# Patient Record
Sex: Female | Born: 1935
Health system: Southern US, Community
[De-identification: ages and names within clinical notes are randomized; demographics above are authoritative.]

## PROBLEM LIST (undated history)

## (undated) DIAGNOSIS — K219 Gastro-esophageal reflux disease without esophagitis: Secondary | ICD-10-CM

## (undated) DIAGNOSIS — D510 Vitamin B12 deficiency anemia due to intrinsic factor deficiency: Secondary | ICD-10-CM

## (undated) DIAGNOSIS — M810 Age-related osteoporosis without current pathological fracture: Secondary | ICD-10-CM

## (undated) DIAGNOSIS — Z8673 Personal history of transient ischemic attack (TIA), and cerebral infarction without residual deficits: Secondary | ICD-10-CM

## (undated) DIAGNOSIS — E7800 Pure hypercholesterolemia, unspecified: Secondary | ICD-10-CM

## (undated) DIAGNOSIS — I10 Essential (primary) hypertension: Secondary | ICD-10-CM

## (undated) DIAGNOSIS — M1990 Unspecified osteoarthritis, unspecified site: Secondary | ICD-10-CM

## (undated) HISTORY — PX: OTHER SURGICAL HISTORY: SHX169

## (undated) HISTORY — DX: Unspecified osteoarthritis, unspecified site: M19.90

## (undated) HISTORY — DX: Personal history of transient ischemic attack (TIA), and cerebral infarction without residual deficits: Z86.73

## (undated) HISTORY — DX: Age-related osteoporosis without current pathological fracture: M81.0

## (undated) HISTORY — DX: Vitamin B12 deficiency anemia due to intrinsic factor deficiency: D51.0

## (undated) HISTORY — DX: Pure hypercholesterolemia, unspecified: E78.00

## (undated) HISTORY — PX: CATARACT EXTRACTION: SUR2

## (undated) HISTORY — DX: Gastro-esophageal reflux disease without esophagitis: K21.9

## (undated) HISTORY — DX: Essential (primary) hypertension: I10

---

## 1997-09-26 DIAGNOSIS — Z8673 Personal history of transient ischemic attack (TIA), and cerebral infarction without residual deficits: Secondary | ICD-10-CM

## 1997-09-26 HISTORY — DX: Personal history of transient ischemic attack (TIA), and cerebral infarction without residual deficits: Z86.73

## 2000-02-06 ENCOUNTER — Emergency Department (HOSPITAL_COMMUNITY): Admission: EM | Admit: 2000-02-06 | Discharge: 2000-02-07 | Payer: Self-pay | Admitting: Emergency Medicine

## 2001-07-11 ENCOUNTER — Other Ambulatory Visit: Admission: RE | Admit: 2001-07-11 | Discharge: 2001-07-11 | Payer: Self-pay | Admitting: Family Medicine

## 2001-11-25 ENCOUNTER — Emergency Department (HOSPITAL_COMMUNITY): Admission: EM | Admit: 2001-11-25 | Discharge: 2001-11-25 | Payer: Self-pay | Admitting: Emergency Medicine

## 2001-11-25 ENCOUNTER — Encounter: Payer: Self-pay | Admitting: Emergency Medicine

## 2002-03-25 ENCOUNTER — Ambulatory Visit (HOSPITAL_BASED_OUTPATIENT_CLINIC_OR_DEPARTMENT_OTHER): Admission: RE | Admit: 2002-03-25 | Discharge: 2002-03-25 | Payer: Self-pay | Admitting: Urology

## 2002-03-25 ENCOUNTER — Encounter: Payer: Self-pay | Admitting: Urology

## 2002-05-21 ENCOUNTER — Ambulatory Visit (HOSPITAL_COMMUNITY): Admission: RE | Admit: 2002-05-21 | Discharge: 2002-05-21 | Payer: Self-pay | Admitting: Gastroenterology

## 2003-07-01 ENCOUNTER — Other Ambulatory Visit: Admission: RE | Admit: 2003-07-01 | Discharge: 2003-07-01 | Payer: Self-pay | Admitting: Family Medicine

## 2006-09-04 ENCOUNTER — Other Ambulatory Visit: Admission: RE | Admit: 2006-09-04 | Discharge: 2006-09-04 | Payer: Self-pay | Admitting: Family Medicine

## 2009-03-04 IMAGING — US US ABDOMEN COMPLETE
1 series · 13 of 25 positions shown · non-contrast
Comparison: NONE

CLINICAL DATA: Abdominal pain. 

ABDOMINAL ULTRASOUND

[Series 1: us abd · 0.28mm/px · 13 of 74 slices shown]
[im 1/74]
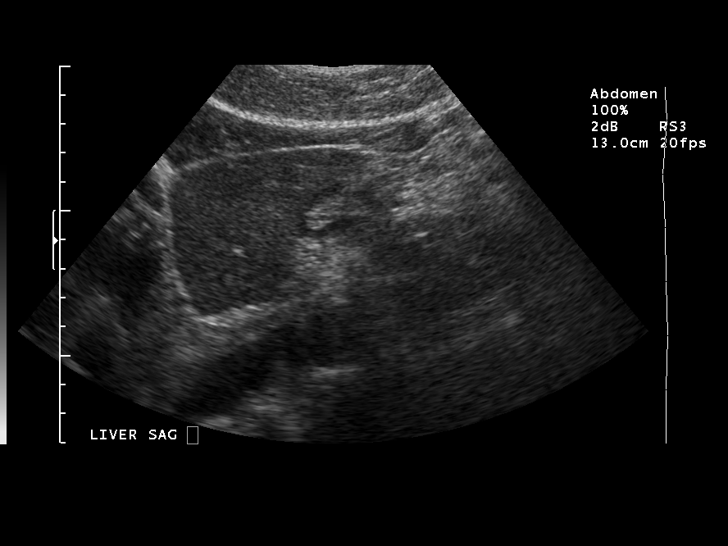
[im 7/74]
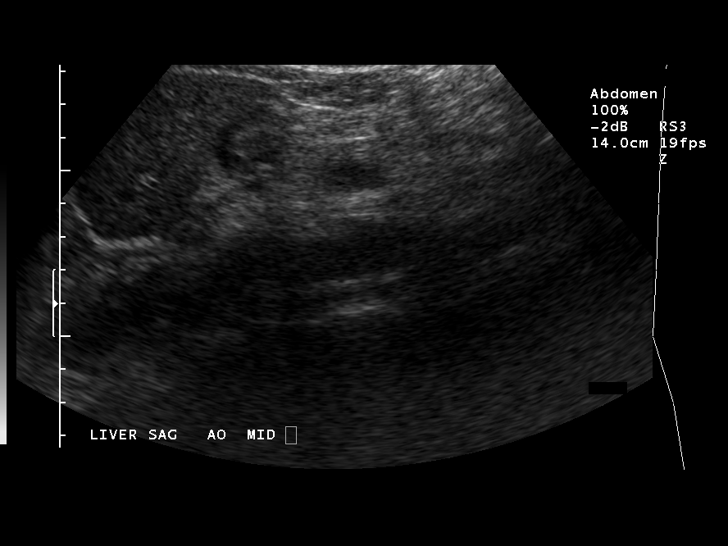
[im 13/74]
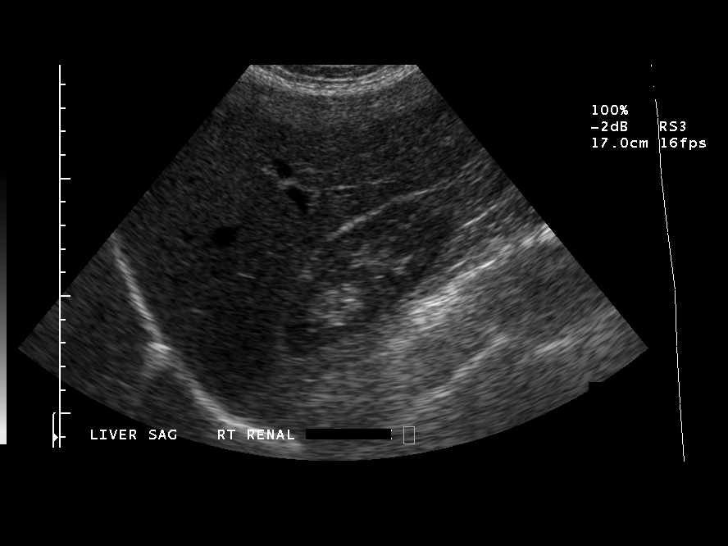
[im 19/74]
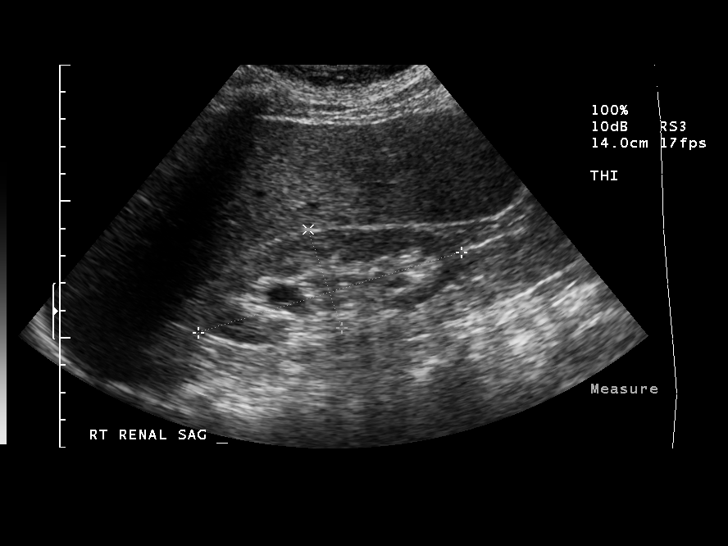
[im 25/74]
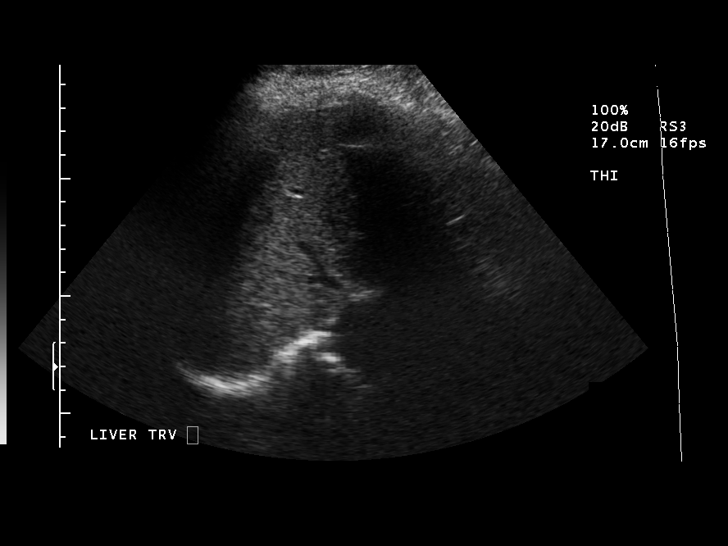
[im 31/74]
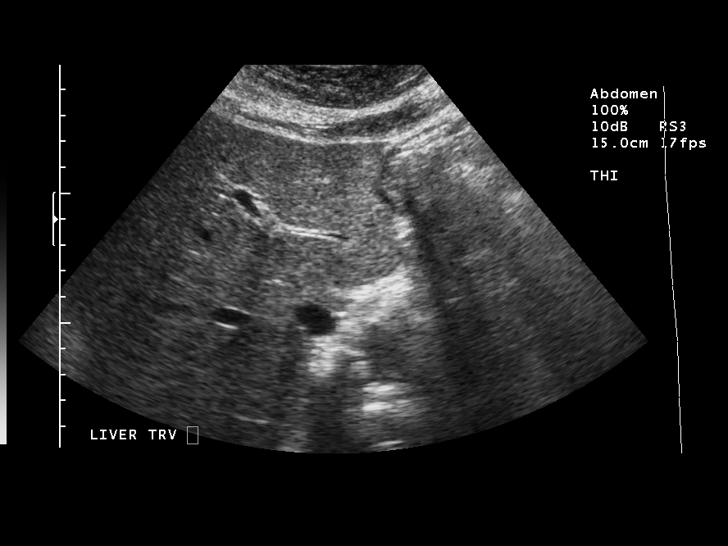
[im 37/74]
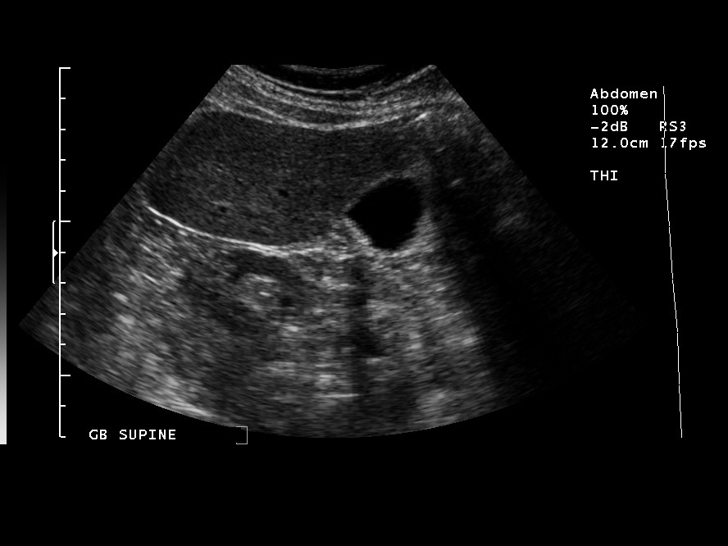
[im 43/74]
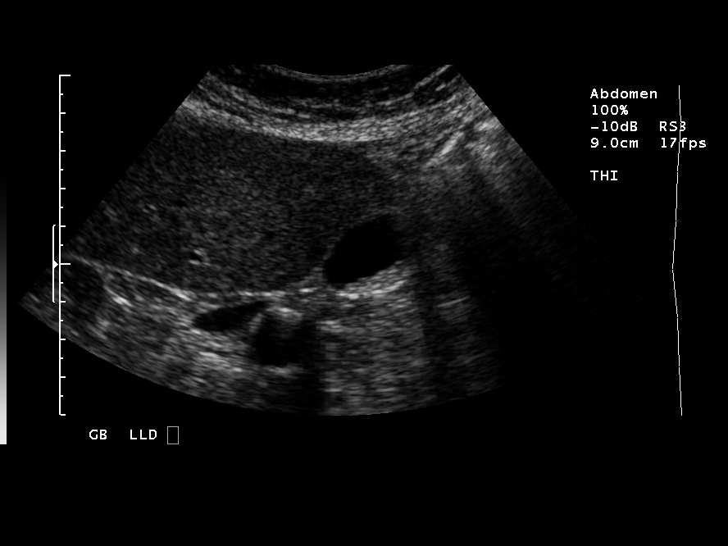
[im 49/74]
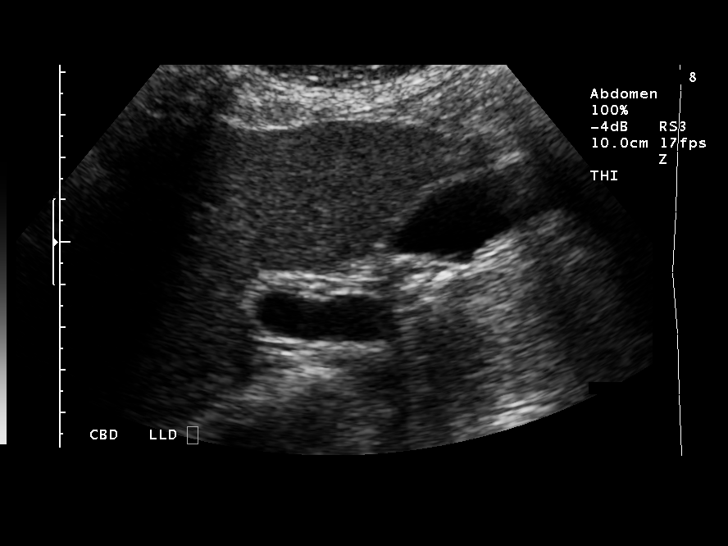
[im 55/74]
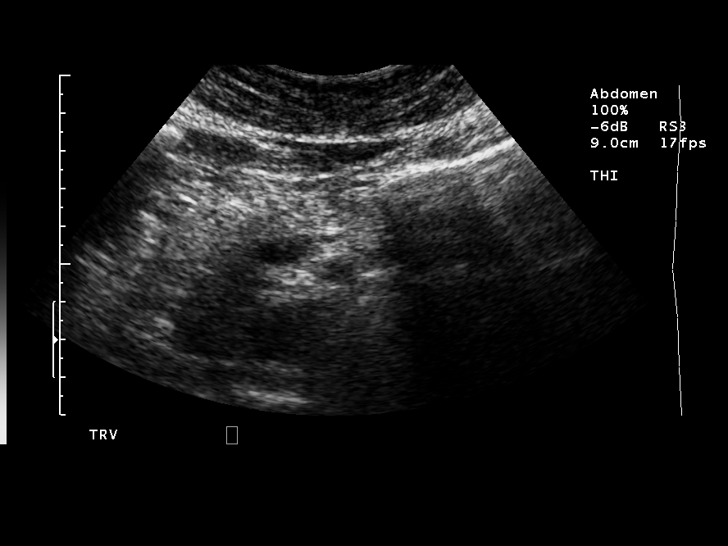
[im 61/74]
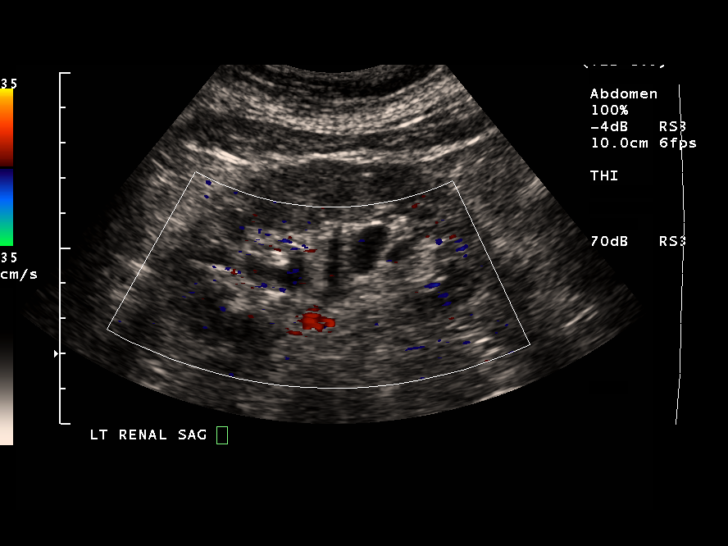
[im 67/74]
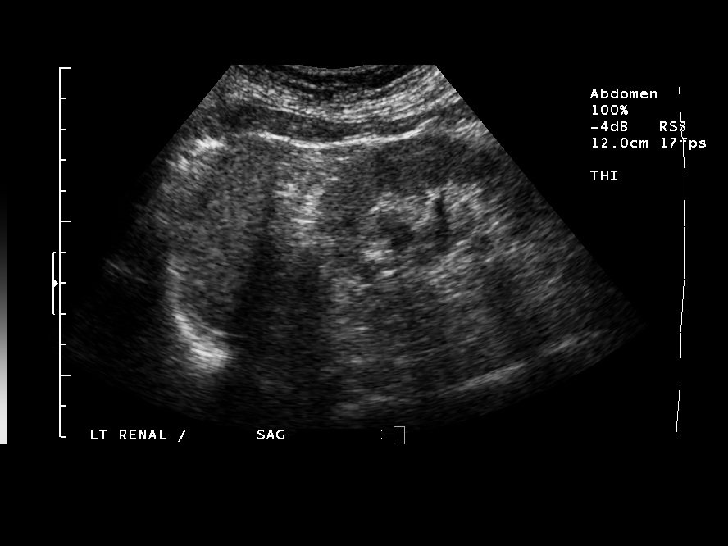
[im 74/74]
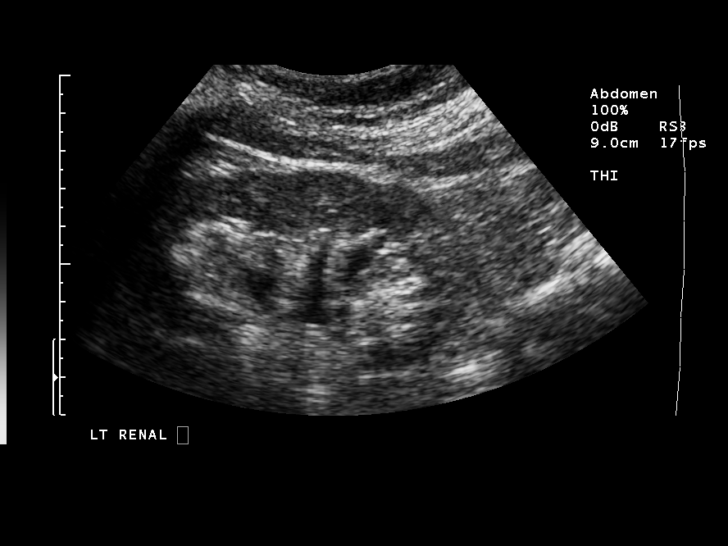

[13 of 25 positions shown; findings below may reference images not displayed]

FINDINGS: The liver is normal in size and echo appearance. There 
is no intrahepatic ductal dilatation or mass identified. The 
gallbladder contains a small amount of dependent echogenic 
material. There is no definite shadowing demonstrated. Appearances 
favor sludge, although the presence of millimeter-sized, 
non-shadowing stones cannot be excluded. The common duct is 
non-dilated measuring 0.29 cm. The pancreas was not well 
demonstrated. The abdominal aorta was normal in caliber and echo 
appearance with AP measurements of 2.4 cm proximally, mid 1.5 cm, 
and distally 1.3 cm. The spleen was normal in size and echo 
appearance measuring 7.7 cm. The right kidney measured 10 cm in 
length. Echo appearance was normal. A prominent calyx versus a 
peripelvic cyst was noted in the upper pole measuring 1.2 cm. The 
left kidney measured 10.5 cm in longitudinal dimension. There was 
dilatation of the left renal pelvis and calices consistent with 
mild hydronephrosis.
IMPRESSION: Sludge within the gallbladder. The possibility of 
millimeter-sized, non-shadowing stones within the sludge cannot be 
excluded. Non-visualization of the pancreas. Prominent right renal 
calyx versus peripelvic cyst. Dilatation of the left pelvis and 
calices suggesting mild hydronephrosis, although this could be 
persistent dilatation from prior obstructive episodes. Yukyuk Khandelwal 
Maruda, M.D. electronically reviewed on 06/27/2007 Dict Date: 
06/27/2007  Tran Date: 06/27/2007 CAV  [REDACTED]

## 2011-05-24 ENCOUNTER — Encounter: Payer: Self-pay | Admitting: *Deleted

## 2011-05-24 ENCOUNTER — Ambulatory Visit (INDEPENDENT_AMBULATORY_CARE_PROVIDER_SITE_OTHER): Payer: Medicare Other | Admitting: Endocrinology

## 2011-05-24 ENCOUNTER — Encounter: Payer: Self-pay | Admitting: Endocrinology

## 2011-05-24 ENCOUNTER — Other Ambulatory Visit (INDEPENDENT_AMBULATORY_CARE_PROVIDER_SITE_OTHER): Payer: Medicare Other

## 2011-05-24 DIAGNOSIS — H409 Unspecified glaucoma: Secondary | ICD-10-CM | POA: Insufficient documentation

## 2011-05-24 DIAGNOSIS — I10 Essential (primary) hypertension: Secondary | ICD-10-CM | POA: Insufficient documentation

## 2011-05-24 DIAGNOSIS — E7800 Pure hypercholesterolemia, unspecified: Secondary | ICD-10-CM

## 2011-05-24 DIAGNOSIS — M810 Age-related osteoporosis without current pathological fracture: Secondary | ICD-10-CM

## 2011-05-24 DIAGNOSIS — K219 Gastro-esophageal reflux disease without esophagitis: Secondary | ICD-10-CM | POA: Insufficient documentation

## 2011-05-24 NOTE — Progress Notes (Signed)
Subjective:    Patient ID: Samantha Thomas, female    DOB: December 20, 1923, 77 y.o.   MRN: [id number]  HPI Pt was noted on screening to have osteoporosis.  She was started on fosamax, but was only able to take 1 dose.  Since she took this, she has had a few weeks of moderate dryness of the mouth, but no assoc exac of her chronic heartburn.  She was on evista x 1 year, but she says this was stopped due to the request of her dentist.   She does not wish to retry the fosamax.  She had menopause at age 73.  She denies h/o renal dz, thyroid problems, prolonged bedrest, steriods, alcoholism, smoking, liver dz, vid-d deficiency, primary hyperparathyroidism, heparin, or seizures.   Past Medical History  Diagnosis Date  . Essential hypertension, benign   . Pure hypercholesterolemia   . Esophageal reflux   . History of stroke   . Glaucoma     Past Surgical History  Procedure Date  . Cataract extraction     History   Social History  . Marital Status: Married    Spouse Name: N/A    Number of Children: 5  . Years of Education: 15   Occupational History  . Retired    Social History Main Topics  . Smoking status: Never Smoker   . Smokeless tobacco: Not on file  . Alcohol Use: No  . Drug Use: No  . Sexually Active: Not on file   Other Topics Concern  . Not on file   Social History Narrative  . No narrative on file    No current outpatient prescriptions on file prior to visit.    Allergies  Allergen Reactions  . Alendronate Sodium     Family History  Problem Relation Age of Onset  . Cancer Mother   . Heart attack Father     died in 38's  . Hypertension Other     Parents  . Diabetes Other     Grandparent  no osteoporosis.  BP 110/70  Pulse 71  Temp(Src) 98.1 F (36.7 C) (Oral)  Ht 5' (1.524 m)  Wt 116 lb 12.8 oz (52.98 kg)  BMI 22.81 kg/m2  SpO2 96%  Review of Systems denies cramps, muscle weakness, n/v, syncope, palpitations, rash, diarrhea, sob, numbness, fever,  urinary frequency, blury vision, and easy bruising.  She has fatigue and rhinorrhea.  She has lost a few lbs, due to her efforts.     Objective:   Physical Exam VS: see vs page GEN: no distress HEAD: head: no deformity eyes: no periorbital swelling, no proptosis external nose and ears are normal mouth: no lesion seen NECK: supple, thyroid is not enlarged CHEST WALL: no deformity.   CV: reg rate and rhythm, no murmur ABD: abdomen is soft, nontender.  no hepatosplenomegaly.  not distended.  no hernia MUSCULOSKELETAL: muscle bulk and strength are grossly normal.  no obvious joint swelling.  gait is normal and steady.  No kyphosis of the spine. EXTEMITIES:  no edema.  There are oa changes on the hands PULSES: dorsalis pedis intact bilat.   NEURO:  cn 2-12 grossly intact.   readily moves all 4's.  sensation is intact to touch on the feet SKIN:  Normal texture and temperature.  No rash or suspicious lesion is visible.   NODES:  None palpable at the neck PSYCH: alert, oriented x3.  Does not appear anxious nor depressed.  outside test results are reviewed: i reviewed dexa Ca++=9.5  Creat=normal    Assessment & Plan:  Osteoporosis.  i feel pt should retry the fosamax, or to take reclast.  Pt declines.  Dry mouth.  Pt feels this was caused by fosamax, but i think this is probably not the cause.  Genella Rife.  This did not appear to worsen with fosamax.

## 2011-05-24 NOTE — Patient Instructions (Addendum)
blood tests are being requested for you today.  please call [phone] to hear your test results.  You will be prompted to enter the 9-digit "MRN" number that appears at the top left of this page, followed by #.  Then you will hear the message. We'll request authorization for "prolia." twice yearly injection.  We'll let you know when to come back for it. it is critically important to prevent falling down (keep floor areas well-lit, dry, and free of loose objects)

## 2011-05-25 LAB — VITAMIN D 25 HYDROXY (VIT D DEFICIENCY, FRACTURES): Vit D, 25-Hydroxy: 55 ng/mL (ref 30–89)

## 2011-05-25 LAB — PTH, INTACT AND CALCIUM
Calcium, Total (PTH): 9.6 mg/dL (ref 8.4–10.5)
PTH: 43.1 pg/mL (ref 14.0–72.0)

## 2011-06-10 ENCOUNTER — Telehealth: Payer: Self-pay | Admitting: *Deleted

## 2011-06-10 NOTE — Telephone Encounter (Signed)
Message copied by Merrilyn Puma on Fri Jun 10, 2011  8:06 AM ------      Message from: Carin Primrose      Created: Thu Jun 02, 2011  1:05 PM      Regarding: RE: Prolia       Yes      ----- Message -----         From: Lanier Prude, CMA         Sent: 06/02/2011  11:41 AM           To: Brenton Grills, CMA      Subject: RE: Prolia                                               No, do I need to verify benefits?      ----- Message -----         From: Brenton Grills, CMA         Sent: 06/02/2011  11:10 AM           To: Lanier Prude, CMA      Subject: Prolia                                                   Did Dr. Everardo All send you a message regarding this pt for Prolia? I just wanted to make sure. Thanks.

## 2011-06-10 NOTE — Telephone Encounter (Signed)
Faxed ins verification request to Prolia. Will wait for benefit summary.

## 2011-06-16 NOTE — Telephone Encounter (Signed)
rec pt's summary of benefits for Prolia. Her OOP is 20% approx $165. Pt informed and she states she will discuss this with her husband and call us back if/when she wants to schedule Prolia inj.

## 2014-11-05 DIAGNOSIS — B351 Tinea unguium: Secondary | ICD-10-CM | POA: Diagnosis not present

## 2015-01-16 DIAGNOSIS — J209 Acute bronchitis, unspecified: Secondary | ICD-10-CM | POA: Diagnosis not present

## 2015-01-23 DIAGNOSIS — J209 Acute bronchitis, unspecified: Secondary | ICD-10-CM | POA: Diagnosis not present

## 2015-02-09 DIAGNOSIS — B351 Tinea unguium: Secondary | ICD-10-CM | POA: Diagnosis not present

## 2015-02-10 DIAGNOSIS — J209 Acute bronchitis, unspecified: Secondary | ICD-10-CM | POA: Diagnosis not present

## 2015-02-12 DIAGNOSIS — J209 Acute bronchitis, unspecified: Secondary | ICD-10-CM | POA: Diagnosis not present

## 2015-02-23 DIAGNOSIS — J209 Acute bronchitis, unspecified: Secondary | ICD-10-CM | POA: Diagnosis not present

## 2015-02-23 DIAGNOSIS — R6889 Other general symptoms and signs: Secondary | ICD-10-CM | POA: Diagnosis not present

## 2015-02-23 DIAGNOSIS — T148 Other injury of unspecified body region: Secondary | ICD-10-CM | POA: Diagnosis not present

## 2015-02-23 DIAGNOSIS — H6120 Impacted cerumen, unspecified ear: Secondary | ICD-10-CM | POA: Diagnosis not present

## 2015-03-06 DIAGNOSIS — H3530 Unspecified macular degeneration: Secondary | ICD-10-CM | POA: Diagnosis not present

## 2015-03-06 DIAGNOSIS — H4011X2 Primary open-angle glaucoma, moderate stage: Secondary | ICD-10-CM | POA: Diagnosis not present

## 2015-03-26 DIAGNOSIS — I10 Essential (primary) hypertension: Secondary | ICD-10-CM | POA: Diagnosis not present

## 2015-03-26 DIAGNOSIS — E780 Pure hypercholesterolemia: Secondary | ICD-10-CM | POA: Diagnosis not present

## 2015-03-26 DIAGNOSIS — E569 Vitamin deficiency, unspecified: Secondary | ICD-10-CM | POA: Diagnosis not present

## 2015-03-26 DIAGNOSIS — I639 Cerebral infarction, unspecified: Secondary | ICD-10-CM | POA: Diagnosis not present

## 2015-04-10 DIAGNOSIS — H4011X2 Primary open-angle glaucoma, moderate stage: Secondary | ICD-10-CM | POA: Diagnosis not present

## 2015-04-25 DIAGNOSIS — E569 Vitamin deficiency, unspecified: Secondary | ICD-10-CM | POA: Diagnosis not present

## 2015-04-25 DIAGNOSIS — K219 Gastro-esophageal reflux disease without esophagitis: Secondary | ICD-10-CM | POA: Diagnosis not present

## 2015-04-25 DIAGNOSIS — E780 Pure hypercholesterolemia: Secondary | ICD-10-CM | POA: Diagnosis not present

## 2015-04-26 DIAGNOSIS — K219 Gastro-esophageal reflux disease without esophagitis: Secondary | ICD-10-CM | POA: Diagnosis not present

## 2015-04-26 DIAGNOSIS — R531 Weakness: Secondary | ICD-10-CM | POA: Diagnosis not present

## 2015-04-26 DIAGNOSIS — R200 Anesthesia of skin: Secondary | ICD-10-CM | POA: Diagnosis not present

## 2015-04-26 DIAGNOSIS — I10 Essential (primary) hypertension: Secondary | ICD-10-CM | POA: Diagnosis not present

## 2015-04-26 DIAGNOSIS — E780 Pure hypercholesterolemia: Secondary | ICD-10-CM | POA: Diagnosis not present

## 2015-04-26 DIAGNOSIS — R202 Paresthesia of skin: Secondary | ICD-10-CM | POA: Diagnosis not present

## 2015-04-26 DIAGNOSIS — D649 Anemia, unspecified: Secondary | ICD-10-CM | POA: Diagnosis not present

## 2015-04-26 DIAGNOSIS — J986 Disorders of diaphragm: Secondary | ICD-10-CM | POA: Diagnosis not present

## 2015-04-26 DIAGNOSIS — R42 Dizziness and giddiness: Secondary | ICD-10-CM | POA: Diagnosis not present

## 2015-04-26 DIAGNOSIS — N3000 Acute cystitis without hematuria: Secondary | ICD-10-CM | POA: Diagnosis not present

## 2015-04-26 DIAGNOSIS — Z79899 Other long term (current) drug therapy: Secondary | ICD-10-CM | POA: Diagnosis not present

## 2015-04-26 DIAGNOSIS — Z8673 Personal history of transient ischemic attack (TIA), and cerebral infarction without residual deficits: Secondary | ICD-10-CM | POA: Diagnosis not present

## 2015-05-05 DIAGNOSIS — N390 Urinary tract infection, site not specified: Secondary | ICD-10-CM | POA: Diagnosis not present

## 2015-05-05 DIAGNOSIS — Z9181 History of falling: Secondary | ICD-10-CM | POA: Diagnosis not present

## 2015-05-05 DIAGNOSIS — Z1389 Encounter for screening for other disorder: Secondary | ICD-10-CM | POA: Diagnosis not present

## 2015-05-05 DIAGNOSIS — I639 Cerebral infarction, unspecified: Secondary | ICD-10-CM | POA: Diagnosis not present

## 2015-05-13 DIAGNOSIS — N3941 Urge incontinence: Secondary | ICD-10-CM | POA: Diagnosis not present

## 2015-05-13 DIAGNOSIS — J302 Other seasonal allergic rhinitis: Secondary | ICD-10-CM | POA: Diagnosis not present

## 2015-05-20 DIAGNOSIS — B351 Tinea unguium: Secondary | ICD-10-CM | POA: Diagnosis not present

## 2015-05-26 DIAGNOSIS — E782 Mixed hyperlipidemia: Secondary | ICD-10-CM | POA: Diagnosis not present

## 2015-05-26 DIAGNOSIS — K219 Gastro-esophageal reflux disease without esophagitis: Secondary | ICD-10-CM | POA: Diagnosis not present

## 2015-05-26 DIAGNOSIS — I639 Cerebral infarction, unspecified: Secondary | ICD-10-CM | POA: Diagnosis not present

## 2015-05-26 DIAGNOSIS — E569 Vitamin deficiency, unspecified: Secondary | ICD-10-CM | POA: Diagnosis not present

## 2015-06-12 DIAGNOSIS — H4011X1 Primary open-angle glaucoma, mild stage: Secondary | ICD-10-CM | POA: Diagnosis not present

## 2015-07-10 DIAGNOSIS — Z23 Encounter for immunization: Secondary | ICD-10-CM | POA: Diagnosis not present

## 2015-08-14 DIAGNOSIS — I10 Essential (primary) hypertension: Secondary | ICD-10-CM | POA: Diagnosis not present

## 2015-08-26 DIAGNOSIS — I639 Cerebral infarction, unspecified: Secondary | ICD-10-CM | POA: Diagnosis not present

## 2015-08-26 DIAGNOSIS — E785 Hyperlipidemia, unspecified: Secondary | ICD-10-CM | POA: Diagnosis not present

## 2015-08-26 DIAGNOSIS — B351 Tinea unguium: Secondary | ICD-10-CM | POA: Diagnosis not present

## 2015-08-26 DIAGNOSIS — E569 Vitamin deficiency, unspecified: Secondary | ICD-10-CM | POA: Diagnosis not present

## 2015-08-26 DIAGNOSIS — K219 Gastro-esophageal reflux disease without esophagitis: Secondary | ICD-10-CM | POA: Diagnosis not present

## 2015-09-11 DIAGNOSIS — E559 Vitamin D deficiency, unspecified: Secondary | ICD-10-CM | POA: Diagnosis not present

## 2015-09-11 DIAGNOSIS — D509 Iron deficiency anemia, unspecified: Secondary | ICD-10-CM | POA: Diagnosis not present

## 2015-09-11 DIAGNOSIS — D519 Vitamin B12 deficiency anemia, unspecified: Secondary | ICD-10-CM | POA: Diagnosis not present

## 2015-09-11 DIAGNOSIS — Z01419 Encounter for gynecological examination (general) (routine) without abnormal findings: Secondary | ICD-10-CM | POA: Diagnosis not present

## 2015-09-11 DIAGNOSIS — K219 Gastro-esophageal reflux disease without esophagitis: Secondary | ICD-10-CM | POA: Diagnosis not present

## 2015-09-11 DIAGNOSIS — Z79899 Other long term (current) drug therapy: Secondary | ICD-10-CM | POA: Diagnosis not present

## 2015-11-25 DIAGNOSIS — B351 Tinea unguium: Secondary | ICD-10-CM | POA: Diagnosis not present

## 2016-03-09 DIAGNOSIS — B351 Tinea unguium: Secondary | ICD-10-CM | POA: Diagnosis not present

## 2016-03-15 DIAGNOSIS — H401131 Primary open-angle glaucoma, bilateral, mild stage: Secondary | ICD-10-CM | POA: Diagnosis not present

## 2016-03-22 DIAGNOSIS — B308 Other viral conjunctivitis: Secondary | ICD-10-CM | POA: Diagnosis not present

## 2016-05-31 DIAGNOSIS — H401131 Primary open-angle glaucoma, bilateral, mild stage: Secondary | ICD-10-CM | POA: Diagnosis not present

## 2016-05-31 DIAGNOSIS — H353130 Nonexudative age-related macular degeneration, bilateral, stage unspecified: Secondary | ICD-10-CM | POA: Diagnosis not present

## 2016-05-31 DIAGNOSIS — H04123 Dry eye syndrome of bilateral lacrimal glands: Secondary | ICD-10-CM | POA: Diagnosis not present

## 2016-06-10 DIAGNOSIS — Z1389 Encounter for screening for other disorder: Secondary | ICD-10-CM | POA: Diagnosis not present

## 2016-06-10 DIAGNOSIS — Z23 Encounter for immunization: Secondary | ICD-10-CM | POA: Diagnosis not present

## 2016-06-10 DIAGNOSIS — Z0000 Encounter for general adult medical examination without abnormal findings: Secondary | ICD-10-CM | POA: Diagnosis not present

## 2016-06-10 DIAGNOSIS — Z79899 Other long term (current) drug therapy: Secondary | ICD-10-CM | POA: Diagnosis not present

## 2016-06-10 DIAGNOSIS — Z9181 History of falling: Secondary | ICD-10-CM | POA: Diagnosis not present

## 2016-06-10 DIAGNOSIS — Z8673 Personal history of transient ischemic attack (TIA), and cerebral infarction without residual deficits: Secondary | ICD-10-CM | POA: Diagnosis not present

## 2016-06-15 DIAGNOSIS — B351 Tinea unguium: Secondary | ICD-10-CM | POA: Diagnosis not present

## 2016-07-20 DIAGNOSIS — H401131 Primary open-angle glaucoma, bilateral, mild stage: Secondary | ICD-10-CM | POA: Diagnosis not present

## 2016-09-21 DIAGNOSIS — B351 Tinea unguium: Secondary | ICD-10-CM | POA: Diagnosis not present

## 2016-11-14 DIAGNOSIS — Z79899 Other long term (current) drug therapy: Secondary | ICD-10-CM | POA: Diagnosis not present

## 2016-11-14 DIAGNOSIS — E559 Vitamin D deficiency, unspecified: Secondary | ICD-10-CM | POA: Diagnosis not present

## 2016-11-14 DIAGNOSIS — Z13820 Encounter for screening for osteoporosis: Secondary | ICD-10-CM | POA: Diagnosis not present

## 2016-11-14 DIAGNOSIS — E785 Hyperlipidemia, unspecified: Secondary | ICD-10-CM | POA: Diagnosis not present

## 2016-11-14 DIAGNOSIS — Z23 Encounter for immunization: Secondary | ICD-10-CM | POA: Diagnosis not present

## 2016-11-14 DIAGNOSIS — K219 Gastro-esophageal reflux disease without esophagitis: Secondary | ICD-10-CM | POA: Diagnosis not present

## 2016-11-14 DIAGNOSIS — Z01419 Encounter for gynecological examination (general) (routine) without abnormal findings: Secondary | ICD-10-CM | POA: Diagnosis not present

## 2016-11-14 DIAGNOSIS — D519 Vitamin B12 deficiency anemia, unspecified: Secondary | ICD-10-CM | POA: Diagnosis not present

## 2016-12-14 DIAGNOSIS — H401131 Primary open-angle glaucoma, bilateral, mild stage: Secondary | ICD-10-CM | POA: Diagnosis not present

## 2016-12-15 DIAGNOSIS — H401132 Primary open-angle glaucoma, bilateral, moderate stage: Secondary | ICD-10-CM | POA: Diagnosis not present

## 2017-01-31 DIAGNOSIS — Z6823 Body mass index (BMI) 23.0-23.9, adult: Secondary | ICD-10-CM | POA: Diagnosis not present

## 2017-01-31 DIAGNOSIS — L03211 Cellulitis of face: Secondary | ICD-10-CM | POA: Diagnosis not present

## 2017-04-26 DIAGNOSIS — B351 Tinea unguium: Secondary | ICD-10-CM | POA: Diagnosis not present

## 2017-05-03 DIAGNOSIS — H353112 Nonexudative age-related macular degeneration, right eye, intermediate dry stage: Secondary | ICD-10-CM | POA: Diagnosis not present

## 2017-05-03 DIAGNOSIS — H353221 Exudative age-related macular degeneration, left eye, with active choroidal neovascularization: Secondary | ICD-10-CM | POA: Diagnosis not present

## 2017-05-03 DIAGNOSIS — H401131 Primary open-angle glaucoma, bilateral, mild stage: Secondary | ICD-10-CM | POA: Diagnosis not present

## 2017-06-15 DIAGNOSIS — H353221 Exudative age-related macular degeneration, left eye, with active choroidal neovascularization: Secondary | ICD-10-CM | POA: Diagnosis not present

## 2017-06-29 DIAGNOSIS — Z23 Encounter for immunization: Secondary | ICD-10-CM | POA: Diagnosis not present

## 2017-07-27 DIAGNOSIS — H353221 Exudative age-related macular degeneration, left eye, with active choroidal neovascularization: Secondary | ICD-10-CM | POA: Diagnosis not present

## 2017-08-09 DIAGNOSIS — B351 Tinea unguium: Secondary | ICD-10-CM | POA: Diagnosis not present

## 2017-08-31 DIAGNOSIS — H353222 Exudative age-related macular degeneration, left eye, with inactive choroidal neovascularization: Secondary | ICD-10-CM | POA: Diagnosis not present

## 2017-10-05 DIAGNOSIS — H353222 Exudative age-related macular degeneration, left eye, with inactive choroidal neovascularization: Secondary | ICD-10-CM | POA: Diagnosis not present

## 2017-11-15 DIAGNOSIS — H401131 Primary open-angle glaucoma, bilateral, mild stage: Secondary | ICD-10-CM | POA: Diagnosis not present

## 2017-11-15 DIAGNOSIS — H353222 Exudative age-related macular degeneration, left eye, with inactive choroidal neovascularization: Secondary | ICD-10-CM | POA: Diagnosis not present

## 2017-11-15 DIAGNOSIS — H353112 Nonexudative age-related macular degeneration, right eye, intermediate dry stage: Secondary | ICD-10-CM | POA: Diagnosis not present

## 2017-11-16 DIAGNOSIS — Z1331 Encounter for screening for depression: Secondary | ICD-10-CM | POA: Diagnosis not present

## 2017-11-16 DIAGNOSIS — Z9181 History of falling: Secondary | ICD-10-CM | POA: Diagnosis not present

## 2017-11-16 DIAGNOSIS — Z0000 Encounter for general adult medical examination without abnormal findings: Secondary | ICD-10-CM | POA: Diagnosis not present

## 2017-11-16 DIAGNOSIS — Z1339 Encounter for screening examination for other mental health and behavioral disorders: Secondary | ICD-10-CM | POA: Diagnosis not present

## 2017-11-16 DIAGNOSIS — D510 Vitamin B12 deficiency anemia due to intrinsic factor deficiency: Secondary | ICD-10-CM | POA: Diagnosis not present

## 2017-11-16 DIAGNOSIS — E785 Hyperlipidemia, unspecified: Secondary | ICD-10-CM | POA: Diagnosis not present

## 2017-11-16 DIAGNOSIS — Z79899 Other long term (current) drug therapy: Secondary | ICD-10-CM | POA: Diagnosis not present

## 2017-11-20 DIAGNOSIS — D729 Disorder of white blood cells, unspecified: Secondary | ICD-10-CM | POA: Diagnosis not present

## 2017-11-30 DIAGNOSIS — H353221 Exudative age-related macular degeneration, left eye, with active choroidal neovascularization: Secondary | ICD-10-CM | POA: Diagnosis not present

## 2017-12-08 DIAGNOSIS — B351 Tinea unguium: Secondary | ICD-10-CM | POA: Diagnosis not present

## 2017-12-08 DIAGNOSIS — L84 Corns and callosities: Secondary | ICD-10-CM | POA: Diagnosis not present

## 2017-12-20 DIAGNOSIS — D510 Vitamin B12 deficiency anemia due to intrinsic factor deficiency: Secondary | ICD-10-CM | POA: Diagnosis not present

## 2018-01-04 DIAGNOSIS — H353221 Exudative age-related macular degeneration, left eye, with active choroidal neovascularization: Secondary | ICD-10-CM | POA: Diagnosis not present

## 2018-01-23 DIAGNOSIS — D72819 Decreased white blood cell count, unspecified: Secondary | ICD-10-CM | POA: Diagnosis not present

## 2018-01-23 DIAGNOSIS — Z6823 Body mass index (BMI) 23.0-23.9, adult: Secondary | ICD-10-CM | POA: Diagnosis not present

## 2018-02-02 DIAGNOSIS — Z79899 Other long term (current) drug therapy: Secondary | ICD-10-CM | POA: Diagnosis not present

## 2018-02-02 DIAGNOSIS — D72819 Decreased white blood cell count, unspecified: Secondary | ICD-10-CM | POA: Diagnosis not present

## 2018-02-08 DIAGNOSIS — H353221 Exudative age-related macular degeneration, left eye, with active choroidal neovascularization: Secondary | ICD-10-CM | POA: Diagnosis not present

## 2018-02-16 DIAGNOSIS — H401132 Primary open-angle glaucoma, bilateral, moderate stage: Secondary | ICD-10-CM | POA: Diagnosis not present

## 2018-03-19 DIAGNOSIS — B351 Tinea unguium: Secondary | ICD-10-CM | POA: Diagnosis not present

## 2018-03-19 DIAGNOSIS — L84 Corns and callosities: Secondary | ICD-10-CM | POA: Diagnosis not present

## 2018-03-22 DIAGNOSIS — H353221 Exudative age-related macular degeneration, left eye, with active choroidal neovascularization: Secondary | ICD-10-CM | POA: Diagnosis not present

## 2018-04-26 DIAGNOSIS — H353221 Exudative age-related macular degeneration, left eye, with active choroidal neovascularization: Secondary | ICD-10-CM | POA: Diagnosis not present

## 2018-05-16 DIAGNOSIS — N390 Urinary tract infection, site not specified: Secondary | ICD-10-CM | POA: Diagnosis not present

## 2018-05-16 DIAGNOSIS — Z6823 Body mass index (BMI) 23.0-23.9, adult: Secondary | ICD-10-CM | POA: Diagnosis not present

## 2018-05-25 DIAGNOSIS — H401131 Primary open-angle glaucoma, bilateral, mild stage: Secondary | ICD-10-CM | POA: Diagnosis not present

## 2018-05-31 DIAGNOSIS — H353221 Exudative age-related macular degeneration, left eye, with active choroidal neovascularization: Secondary | ICD-10-CM | POA: Diagnosis not present

## 2018-06-25 DIAGNOSIS — M79675 Pain in left toe(s): Secondary | ICD-10-CM | POA: Diagnosis not present

## 2018-06-25 DIAGNOSIS — M79674 Pain in right toe(s): Secondary | ICD-10-CM | POA: Diagnosis not present

## 2018-06-25 DIAGNOSIS — B351 Tinea unguium: Secondary | ICD-10-CM | POA: Diagnosis not present

## 2018-06-28 DIAGNOSIS — Z23 Encounter for immunization: Secondary | ICD-10-CM | POA: Diagnosis not present

## 2018-07-05 DIAGNOSIS — H353221 Exudative age-related macular degeneration, left eye, with active choroidal neovascularization: Secondary | ICD-10-CM | POA: Diagnosis not present

## 2018-07-06 DIAGNOSIS — M25611 Stiffness of right shoulder, not elsewhere classified: Secondary | ICD-10-CM | POA: Diagnosis not present

## 2018-07-06 DIAGNOSIS — M6281 Muscle weakness (generalized): Secondary | ICD-10-CM | POA: Diagnosis not present

## 2018-07-06 DIAGNOSIS — M25511 Pain in right shoulder: Secondary | ICD-10-CM | POA: Diagnosis not present

## 2018-07-12 DIAGNOSIS — M6281 Muscle weakness (generalized): Secondary | ICD-10-CM | POA: Diagnosis not present

## 2018-07-12 DIAGNOSIS — M25611 Stiffness of right shoulder, not elsewhere classified: Secondary | ICD-10-CM | POA: Diagnosis not present

## 2018-07-12 DIAGNOSIS — M25511 Pain in right shoulder: Secondary | ICD-10-CM | POA: Diagnosis not present

## 2018-07-20 DIAGNOSIS — M25511 Pain in right shoulder: Secondary | ICD-10-CM | POA: Diagnosis not present

## 2018-07-20 DIAGNOSIS — M25611 Stiffness of right shoulder, not elsewhere classified: Secondary | ICD-10-CM | POA: Diagnosis not present

## 2018-07-20 DIAGNOSIS — M6281 Muscle weakness (generalized): Secondary | ICD-10-CM | POA: Diagnosis not present

## 2018-07-27 DIAGNOSIS — M25511 Pain in right shoulder: Secondary | ICD-10-CM | POA: Diagnosis not present

## 2018-07-27 DIAGNOSIS — M25611 Stiffness of right shoulder, not elsewhere classified: Secondary | ICD-10-CM | POA: Diagnosis not present

## 2018-07-27 DIAGNOSIS — M6281 Muscle weakness (generalized): Secondary | ICD-10-CM | POA: Diagnosis not present

## 2018-08-03 DIAGNOSIS — M6281 Muscle weakness (generalized): Secondary | ICD-10-CM | POA: Diagnosis not present

## 2018-08-03 DIAGNOSIS — M25511 Pain in right shoulder: Secondary | ICD-10-CM | POA: Diagnosis not present

## 2018-08-03 DIAGNOSIS — M25611 Stiffness of right shoulder, not elsewhere classified: Secondary | ICD-10-CM | POA: Diagnosis not present

## 2018-08-09 DIAGNOSIS — H353222 Exudative age-related macular degeneration, left eye, with inactive choroidal neovascularization: Secondary | ICD-10-CM | POA: Diagnosis not present

## 2018-08-09 DIAGNOSIS — H353112 Nonexudative age-related macular degeneration, right eye, intermediate dry stage: Secondary | ICD-10-CM | POA: Diagnosis not present

## 2018-08-10 DIAGNOSIS — M25611 Stiffness of right shoulder, not elsewhere classified: Secondary | ICD-10-CM | POA: Diagnosis not present

## 2018-08-10 DIAGNOSIS — M25511 Pain in right shoulder: Secondary | ICD-10-CM | POA: Diagnosis not present

## 2018-08-10 DIAGNOSIS — M6281 Muscle weakness (generalized): Secondary | ICD-10-CM | POA: Diagnosis not present

## 2018-08-16 DIAGNOSIS — H401131 Primary open-angle glaucoma, bilateral, mild stage: Secondary | ICD-10-CM | POA: Diagnosis not present

## 2018-08-16 DIAGNOSIS — H353222 Exudative age-related macular degeneration, left eye, with inactive choroidal neovascularization: Secondary | ICD-10-CM | POA: Diagnosis not present

## 2018-08-20 DIAGNOSIS — I10 Essential (primary) hypertension: Secondary | ICD-10-CM | POA: Diagnosis not present

## 2018-08-20 DIAGNOSIS — Z6823 Body mass index (BMI) 23.0-23.9, adult: Secondary | ICD-10-CM | POA: Diagnosis not present

## 2018-08-20 DIAGNOSIS — R109 Unspecified abdominal pain: Secondary | ICD-10-CM | POA: Diagnosis not present

## 2018-08-27 DIAGNOSIS — M6281 Muscle weakness (generalized): Secondary | ICD-10-CM | POA: Diagnosis not present

## 2018-08-27 DIAGNOSIS — M25611 Stiffness of right shoulder, not elsewhere classified: Secondary | ICD-10-CM | POA: Diagnosis not present

## 2018-08-27 DIAGNOSIS — M25511 Pain in right shoulder: Secondary | ICD-10-CM | POA: Diagnosis not present

## 2018-09-07 DIAGNOSIS — M25611 Stiffness of right shoulder, not elsewhere classified: Secondary | ICD-10-CM | POA: Diagnosis not present

## 2018-09-07 DIAGNOSIS — M25511 Pain in right shoulder: Secondary | ICD-10-CM | POA: Diagnosis not present

## 2018-09-07 DIAGNOSIS — M6281 Muscle weakness (generalized): Secondary | ICD-10-CM | POA: Diagnosis not present

## 2018-09-14 DIAGNOSIS — H401131 Primary open-angle glaucoma, bilateral, mild stage: Secondary | ICD-10-CM | POA: Diagnosis not present

## 2018-10-01 DIAGNOSIS — B351 Tinea unguium: Secondary | ICD-10-CM | POA: Diagnosis not present

## 2018-10-01 DIAGNOSIS — L84 Corns and callosities: Secondary | ICD-10-CM | POA: Diagnosis not present

## 2018-12-06 DIAGNOSIS — E559 Vitamin D deficiency, unspecified: Secondary | ICD-10-CM | POA: Diagnosis not present

## 2018-12-06 DIAGNOSIS — Z0000 Encounter for general adult medical examination without abnormal findings: Secondary | ICD-10-CM | POA: Diagnosis not present

## 2018-12-06 DIAGNOSIS — Z79899 Other long term (current) drug therapy: Secondary | ICD-10-CM | POA: Diagnosis not present

## 2018-12-06 DIAGNOSIS — Z6823 Body mass index (BMI) 23.0-23.9, adult: Secondary | ICD-10-CM | POA: Diagnosis not present

## 2018-12-06 DIAGNOSIS — Z9181 History of falling: Secondary | ICD-10-CM | POA: Diagnosis not present

## 2018-12-07 DIAGNOSIS — H401131 Primary open-angle glaucoma, bilateral, mild stage: Secondary | ICD-10-CM | POA: Diagnosis not present

## 2019-01-21 DIAGNOSIS — M21961 Unspecified acquired deformity of right lower leg: Secondary | ICD-10-CM | POA: Diagnosis not present

## 2019-01-21 DIAGNOSIS — L84 Corns and callosities: Secondary | ICD-10-CM | POA: Diagnosis not present

## 2019-01-21 DIAGNOSIS — B351 Tinea unguium: Secondary | ICD-10-CM | POA: Diagnosis not present

## 2019-01-21 DIAGNOSIS — M79672 Pain in left foot: Secondary | ICD-10-CM | POA: Diagnosis not present

## 2019-01-21 DIAGNOSIS — M79671 Pain in right foot: Secondary | ICD-10-CM | POA: Diagnosis not present

## 2019-01-24 DIAGNOSIS — I10 Essential (primary) hypertension: Secondary | ICD-10-CM | POA: Diagnosis not present

## 2019-01-24 DIAGNOSIS — K219 Gastro-esophageal reflux disease without esophagitis: Secondary | ICD-10-CM | POA: Diagnosis not present

## 2019-02-15 DIAGNOSIS — H353221 Exudative age-related macular degeneration, left eye, with active choroidal neovascularization: Secondary | ICD-10-CM | POA: Diagnosis not present

## 2019-02-15 DIAGNOSIS — H353112 Nonexudative age-related macular degeneration, right eye, intermediate dry stage: Secondary | ICD-10-CM | POA: Diagnosis not present

## 2019-02-23 DIAGNOSIS — D519 Vitamin B12 deficiency anemia, unspecified: Secondary | ICD-10-CM | POA: Diagnosis not present

## 2019-02-23 DIAGNOSIS — I10 Essential (primary) hypertension: Secondary | ICD-10-CM | POA: Diagnosis not present

## 2019-03-08 DIAGNOSIS — H401131 Primary open-angle glaucoma, bilateral, mild stage: Secondary | ICD-10-CM | POA: Diagnosis not present

## 2019-03-21 DIAGNOSIS — H353221 Exudative age-related macular degeneration, left eye, with active choroidal neovascularization: Secondary | ICD-10-CM | POA: Diagnosis not present

## 2019-03-26 DIAGNOSIS — K219 Gastro-esophageal reflux disease without esophagitis: Secondary | ICD-10-CM | POA: Diagnosis not present

## 2019-03-26 DIAGNOSIS — D519 Vitamin B12 deficiency anemia, unspecified: Secondary | ICD-10-CM | POA: Diagnosis not present

## 2019-03-26 DIAGNOSIS — I10 Essential (primary) hypertension: Secondary | ICD-10-CM | POA: Diagnosis not present

## 2019-03-26 DIAGNOSIS — E785 Hyperlipidemia, unspecified: Secondary | ICD-10-CM | POA: Diagnosis not present

## 2019-04-03 DIAGNOSIS — E559 Vitamin D deficiency, unspecified: Secondary | ICD-10-CM | POA: Diagnosis not present

## 2019-04-03 DIAGNOSIS — D519 Vitamin B12 deficiency anemia, unspecified: Secondary | ICD-10-CM | POA: Diagnosis not present

## 2019-04-03 DIAGNOSIS — Z79899 Other long term (current) drug therapy: Secondary | ICD-10-CM | POA: Diagnosis not present

## 2019-04-03 DIAGNOSIS — E785 Hyperlipidemia, unspecified: Secondary | ICD-10-CM | POA: Diagnosis not present

## 2019-04-24 DIAGNOSIS — B351 Tinea unguium: Secondary | ICD-10-CM | POA: Diagnosis not present

## 2019-04-24 DIAGNOSIS — M79674 Pain in right toe(s): Secondary | ICD-10-CM | POA: Diagnosis not present

## 2019-04-24 DIAGNOSIS — M79671 Pain in right foot: Secondary | ICD-10-CM | POA: Diagnosis not present

## 2019-04-24 DIAGNOSIS — L84 Corns and callosities: Secondary | ICD-10-CM | POA: Diagnosis not present

## 2019-04-24 DIAGNOSIS — M79675 Pain in left toe(s): Secondary | ICD-10-CM | POA: Diagnosis not present

## 2019-04-26 DIAGNOSIS — D519 Vitamin B12 deficiency anemia, unspecified: Secondary | ICD-10-CM | POA: Diagnosis not present

## 2019-04-26 DIAGNOSIS — E782 Mixed hyperlipidemia: Secondary | ICD-10-CM | POA: Diagnosis not present

## 2019-06-04 DIAGNOSIS — H353221 Exudative age-related macular degeneration, left eye, with active choroidal neovascularization: Secondary | ICD-10-CM | POA: Diagnosis not present

## 2019-07-08 DIAGNOSIS — Z23 Encounter for immunization: Secondary | ICD-10-CM | POA: Diagnosis not present

## 2019-07-12 DIAGNOSIS — H401131 Primary open-angle glaucoma, bilateral, mild stage: Secondary | ICD-10-CM | POA: Diagnosis not present

## 2019-07-22 DIAGNOSIS — M546 Pain in thoracic spine: Secondary | ICD-10-CM | POA: Diagnosis not present

## 2019-07-22 DIAGNOSIS — Z6821 Body mass index (BMI) 21.0-21.9, adult: Secondary | ICD-10-CM | POA: Diagnosis not present

## 2019-07-22 DIAGNOSIS — M810 Age-related osteoporosis without current pathological fracture: Secondary | ICD-10-CM | POA: Diagnosis not present

## 2019-07-31 DIAGNOSIS — M25512 Pain in left shoulder: Secondary | ICD-10-CM | POA: Diagnosis not present

## 2019-07-31 DIAGNOSIS — M4722 Other spondylosis with radiculopathy, cervical region: Secondary | ICD-10-CM | POA: Diagnosis not present

## 2019-07-31 DIAGNOSIS — M2560 Stiffness of unspecified joint, not elsewhere classified: Secondary | ICD-10-CM | POA: Diagnosis not present

## 2019-07-31 DIAGNOSIS — M6281 Muscle weakness (generalized): Secondary | ICD-10-CM | POA: Diagnosis not present

## 2019-08-06 DIAGNOSIS — M6281 Muscle weakness (generalized): Secondary | ICD-10-CM | POA: Diagnosis not present

## 2019-08-06 DIAGNOSIS — M4722 Other spondylosis with radiculopathy, cervical region: Secondary | ICD-10-CM | POA: Diagnosis not present

## 2019-08-06 DIAGNOSIS — M25512 Pain in left shoulder: Secondary | ICD-10-CM | POA: Diagnosis not present

## 2019-08-06 DIAGNOSIS — M2560 Stiffness of unspecified joint, not elsewhere classified: Secondary | ICD-10-CM | POA: Diagnosis not present

## 2019-08-07 DIAGNOSIS — L84 Corns and callosities: Secondary | ICD-10-CM | POA: Diagnosis not present

## 2019-08-07 DIAGNOSIS — B351 Tinea unguium: Secondary | ICD-10-CM | POA: Diagnosis not present

## 2019-08-14 DIAGNOSIS — M6281 Muscle weakness (generalized): Secondary | ICD-10-CM | POA: Diagnosis not present

## 2019-08-14 DIAGNOSIS — M2560 Stiffness of unspecified joint, not elsewhere classified: Secondary | ICD-10-CM | POA: Diagnosis not present

## 2019-08-14 DIAGNOSIS — M25512 Pain in left shoulder: Secondary | ICD-10-CM | POA: Diagnosis not present

## 2019-08-14 DIAGNOSIS — M4722 Other spondylosis with radiculopathy, cervical region: Secondary | ICD-10-CM | POA: Diagnosis not present

## 2019-08-20 DIAGNOSIS — M4722 Other spondylosis with radiculopathy, cervical region: Secondary | ICD-10-CM | POA: Diagnosis not present

## 2019-08-20 DIAGNOSIS — M6281 Muscle weakness (generalized): Secondary | ICD-10-CM | POA: Diagnosis not present

## 2019-08-20 DIAGNOSIS — M25512 Pain in left shoulder: Secondary | ICD-10-CM | POA: Diagnosis not present

## 2019-08-20 DIAGNOSIS — M2560 Stiffness of unspecified joint, not elsewhere classified: Secondary | ICD-10-CM | POA: Diagnosis not present

## 2019-08-27 DIAGNOSIS — I10 Essential (primary) hypertension: Secondary | ICD-10-CM | POA: Diagnosis not present

## 2019-08-27 DIAGNOSIS — E7800 Pure hypercholesterolemia, unspecified: Secondary | ICD-10-CM | POA: Diagnosis not present

## 2019-08-27 DIAGNOSIS — Z8673 Personal history of transient ischemic attack (TIA), and cerebral infarction without residual deficits: Secondary | ICD-10-CM | POA: Diagnosis not present

## 2019-08-27 DIAGNOSIS — Z6821 Body mass index (BMI) 21.0-21.9, adult: Secondary | ICD-10-CM | POA: Diagnosis not present

## 2019-08-28 DIAGNOSIS — M6281 Muscle weakness (generalized): Secondary | ICD-10-CM | POA: Diagnosis not present

## 2019-08-28 DIAGNOSIS — M4722 Other spondylosis with radiculopathy, cervical region: Secondary | ICD-10-CM | POA: Diagnosis not present

## 2019-08-28 DIAGNOSIS — M25512 Pain in left shoulder: Secondary | ICD-10-CM | POA: Diagnosis not present

## 2019-08-28 DIAGNOSIS — M2560 Stiffness of unspecified joint, not elsewhere classified: Secondary | ICD-10-CM | POA: Diagnosis not present

## 2019-09-04 DIAGNOSIS — M6281 Muscle weakness (generalized): Secondary | ICD-10-CM | POA: Diagnosis not present

## 2019-09-04 DIAGNOSIS — M25512 Pain in left shoulder: Secondary | ICD-10-CM | POA: Diagnosis not present

## 2019-09-04 DIAGNOSIS — M4722 Other spondylosis with radiculopathy, cervical region: Secondary | ICD-10-CM | POA: Diagnosis not present

## 2019-09-04 DIAGNOSIS — M2560 Stiffness of unspecified joint, not elsewhere classified: Secondary | ICD-10-CM | POA: Diagnosis not present

## 2019-09-11 DIAGNOSIS — M4722 Other spondylosis with radiculopathy, cervical region: Secondary | ICD-10-CM | POA: Diagnosis not present

## 2019-09-11 DIAGNOSIS — M25512 Pain in left shoulder: Secondary | ICD-10-CM | POA: Diagnosis not present

## 2019-09-11 DIAGNOSIS — M6281 Muscle weakness (generalized): Secondary | ICD-10-CM | POA: Diagnosis not present

## 2019-09-11 DIAGNOSIS — M2560 Stiffness of unspecified joint, not elsewhere classified: Secondary | ICD-10-CM | POA: Diagnosis not present

## 2019-09-18 DIAGNOSIS — M25512 Pain in left shoulder: Secondary | ICD-10-CM | POA: Diagnosis not present

## 2019-09-18 DIAGNOSIS — M6281 Muscle weakness (generalized): Secondary | ICD-10-CM | POA: Diagnosis not present

## 2019-09-18 DIAGNOSIS — M2560 Stiffness of unspecified joint, not elsewhere classified: Secondary | ICD-10-CM | POA: Diagnosis not present

## 2019-09-18 DIAGNOSIS — M4722 Other spondylosis with radiculopathy, cervical region: Secondary | ICD-10-CM | POA: Diagnosis not present

## 2019-09-25 DIAGNOSIS — M2560 Stiffness of unspecified joint, not elsewhere classified: Secondary | ICD-10-CM | POA: Diagnosis not present

## 2019-09-25 DIAGNOSIS — M6281 Muscle weakness (generalized): Secondary | ICD-10-CM | POA: Diagnosis not present

## 2019-09-25 DIAGNOSIS — M4722 Other spondylosis with radiculopathy, cervical region: Secondary | ICD-10-CM | POA: Diagnosis not present

## 2019-09-25 DIAGNOSIS — M25512 Pain in left shoulder: Secondary | ICD-10-CM | POA: Diagnosis not present

## 2019-10-02 DIAGNOSIS — M6281 Muscle weakness (generalized): Secondary | ICD-10-CM | POA: Diagnosis not present

## 2019-10-02 DIAGNOSIS — M4722 Other spondylosis with radiculopathy, cervical region: Secondary | ICD-10-CM | POA: Diagnosis not present

## 2019-10-02 DIAGNOSIS — M25512 Pain in left shoulder: Secondary | ICD-10-CM | POA: Diagnosis not present

## 2019-10-02 DIAGNOSIS — M2560 Stiffness of unspecified joint, not elsewhere classified: Secondary | ICD-10-CM | POA: Diagnosis not present

## 2019-10-18 DIAGNOSIS — Z79899 Other long term (current) drug therapy: Secondary | ICD-10-CM | POA: Diagnosis not present

## 2019-10-22 DIAGNOSIS — Z6821 Body mass index (BMI) 21.0-21.9, adult: Secondary | ICD-10-CM | POA: Diagnosis not present

## 2019-10-22 DIAGNOSIS — R413 Other amnesia: Secondary | ICD-10-CM | POA: Diagnosis not present

## 2019-10-25 DIAGNOSIS — K5900 Constipation, unspecified: Secondary | ICD-10-CM | POA: Diagnosis not present

## 2019-10-25 DIAGNOSIS — M1990 Unspecified osteoarthritis, unspecified site: Secondary | ICD-10-CM | POA: Diagnosis not present

## 2019-10-25 DIAGNOSIS — Z6821 Body mass index (BMI) 21.0-21.9, adult: Secondary | ICD-10-CM | POA: Diagnosis not present

## 2019-10-25 DIAGNOSIS — R413 Other amnesia: Secondary | ICD-10-CM | POA: Diagnosis not present

## 2019-10-26 DIAGNOSIS — K219 Gastro-esophageal reflux disease without esophagitis: Secondary | ICD-10-CM | POA: Diagnosis not present

## 2019-10-26 DIAGNOSIS — I10 Essential (primary) hypertension: Secondary | ICD-10-CM | POA: Diagnosis not present

## 2019-10-26 DIAGNOSIS — D519 Vitamin B12 deficiency anemia, unspecified: Secondary | ICD-10-CM | POA: Diagnosis not present

## 2019-10-28 DIAGNOSIS — M25511 Pain in right shoulder: Secondary | ICD-10-CM | POA: Diagnosis not present

## 2019-10-28 DIAGNOSIS — K5900 Constipation, unspecified: Secondary | ICD-10-CM | POA: Diagnosis not present

## 2019-10-28 DIAGNOSIS — Z6821 Body mass index (BMI) 21.0-21.9, adult: Secondary | ICD-10-CM | POA: Diagnosis not present

## 2019-11-04 DIAGNOSIS — M75101 Unspecified rotator cuff tear or rupture of right shoulder, not specified as traumatic: Secondary | ICD-10-CM | POA: Diagnosis not present

## 2019-11-04 DIAGNOSIS — M12811 Other specific arthropathies, not elsewhere classified, right shoulder: Secondary | ICD-10-CM | POA: Diagnosis not present

## 2019-11-20 DIAGNOSIS — B351 Tinea unguium: Secondary | ICD-10-CM | POA: Diagnosis not present

## 2019-11-20 DIAGNOSIS — L84 Corns and callosities: Secondary | ICD-10-CM | POA: Diagnosis not present

## 2019-11-20 DIAGNOSIS — M79675 Pain in left toe(s): Secondary | ICD-10-CM | POA: Diagnosis not present

## 2019-11-20 DIAGNOSIS — M79674 Pain in right toe(s): Secondary | ICD-10-CM | POA: Diagnosis not present

## 2019-11-24 DIAGNOSIS — I10 Essential (primary) hypertension: Secondary | ICD-10-CM | POA: Diagnosis not present

## 2019-11-24 DIAGNOSIS — E7800 Pure hypercholesterolemia, unspecified: Secondary | ICD-10-CM | POA: Diagnosis not present

## 2019-12-05 ENCOUNTER — Telehealth: Payer: Self-pay | Admitting: Neurology

## 2019-12-05 ENCOUNTER — Ambulatory Visit: Payer: Medicare Other | Admitting: Neurology

## 2019-12-05 ENCOUNTER — Encounter: Payer: Self-pay | Admitting: *Deleted

## 2019-12-05 ENCOUNTER — Encounter: Payer: Self-pay | Admitting: Neurology

## 2019-12-05 ENCOUNTER — Other Ambulatory Visit: Payer: Self-pay

## 2019-12-05 VITALS — BP 150/80 | HR 61 | Temp 96.9°F | Ht 61.0 in | Wt 111.0 lb

## 2019-12-05 DIAGNOSIS — R269 Unspecified abnormalities of gait and mobility: Secondary | ICD-10-CM | POA: Diagnosis not present

## 2019-12-05 DIAGNOSIS — R4701 Aphasia: Secondary | ICD-10-CM

## 2019-12-05 DIAGNOSIS — R413 Other amnesia: Secondary | ICD-10-CM | POA: Diagnosis not present

## 2019-12-05 NOTE — Progress Notes (Signed)
[facility]    Provider:  Dr Lucia Gaskins Requesting Provider: Noni Saupe, MD Primary Care Provider:  Noni Saupe, MD  CC:  Memory loss  HPI:  Samantha Thomas is a 86 y.o. female here as requested by Noni Saupe, MD for memory impairment.  Past medical history hypercholesterolemia, stroke, glaucoma, benign essential hypertension, esophageal reflux.  I reviewed Dr. Marnee Guarneri notes, patient was last seen October 22, 2019 presenting with a complaint of memory impairment, occurring in a persistent pattern for months, gradually worsening, characterized as partial, no associated alcoholism, anxiety, depression, focal neurologic deficits, headache or nausea, gradually worsening.  Labs that were taken include B12, TSH.  Labs from April 03, 2019 included a normal CBC, CMP with BUN 23 and creatinine 1.1.  She had a difficult time remembering her daughter's last name, she did eventually remember it. She exercises and walks around the house. 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 Husband has paid the bills for the last 30 years. They live in a 3-bedroom home, they take care of it themselves, she has vision changes and can't see the cobwebs as well as she used to so daughter helps her. She stopped driving due to vision. Husband provides much information, he says she has an extensive long-range memory but she is forgetting names, sometimes full comprehension on what someone says. She has hearing difficulty and she feels some of the comprehension problems is due to her hearing. One sister who had a problem, she had problems in the front of her brain at the age of 32, she was an alcoholic.Other sister Is fine. No  depression.   Reviewed notes, labs and imaging from outside physicians, which showed:  Reviewed CT head report from 2003:  FINDINGS: AGAIN DEMONSTRATED ARE PERIVENTRICULAR WHITE MATTER CHANGES.  AN OLD RIGHT BASAL GANGLIA INTERNAL CAPSULE INFARCT IS AGAIN DEMONSTRATED.  THERE IS NO EVIDENCE OF ACUTE INTRACRANIAL HEMORRHAGE, MASS EFFECT, OR HYDROCEPHALUS.  THE BRAIN PARENCHYMA IS OTHERWISE UNREMARKABLE EXCEPT FOR  BASAL GANGLIA CALCIFICATIONS.  THE SKULL BASE IS WITHIN NORMAL LIMITS. IMPRESSION NO ACUTE INTRACRANIAL ABNORMALITY.  Review of Systems: Patient complains of symptoms per HPI as well as the following symptoms: chronic back pain and arthritis. Pertinent negatives and positives per HPI. All others negative.   Social History   Socioeconomic History  . Marital status: Married    Spouse name: Not on file  . Number of children: 5  . Years of education: 74  . Highest education level: Not on file  Occupational History  . Occupation: Retired  Tobacco Use  . Smoking status: Never Smoker  . Smokeless tobacco: Never Used  Substance and Sexual Activity  . Alcohol use: Never  . Drug use: Never  . Sexual activity: Not on file  Other Topics Concern  . Not on file  Social History Narrative   Lives at home with husband   Caffeine: occasionally    Social Determinants of Health   Financial Resource Strain:   . Difficulty of Paying Living Expenses:   Food Insecurity:   . Worried About Programme researcher, broadcasting/film/video in the Last Year:   . Barista in the Last Year:   Transportation Needs:   .  Lack of Transportation (Medical):   Marland Kitchen Lack of Transportation (Non-Medical):   Physical Activity:   . Days of Exercise per Week:   . Minutes of Exercise per Session:   Stress:   . Feeling of Stress :   Social Connections:   . Frequency of Communication with Friends and Family:   . Frequency of Social Gatherings with Friends and Family:   . Attends Religious Services:   . Active Member of Clubs  or Organizations:   . Attends Archivist Meetings:   Marland Kitchen Marital Status:   Intimate Partner Violence:   . Fear of Current or Ex-Partner:   . Emotionally Abused:   Marland Kitchen Physically Abused:   . Sexually Abused:     Family History  Problem Relation Age of Onset  . Cancer Mother        intestine  . Heart attack Father        died in 60's  . Hypertension Other        Parents  . Diabetes Other        Grandparent  . Memory loss Sister        "had to do with the front side of her head"  . Alcoholism Sister     Past Medical History:  Diagnosis Date  . Arthritis   . Esophageal reflux   . Essential hypertension, benign   . Glaucoma   . History of stroke 1999   "slight stroke"  . Osteoporosis   . Pernicious anemia   . Pure hypercholesterolemia     Patient Active Problem List   Diagnosis Date Noted  . Osteoporosis 05/24/2011  . Esophageal reflux   . Glaucoma   . Pure hypercholesterolemia   . Essential hypertension, benign     Past Surgical History:  Procedure Laterality Date  . bone spur  Right 1998   between toes   . CATARACT EXTRACTION    . insert prosthetic lens      Current Outpatient Medications  Medication Sig Dispense Refill  . atorvastatin (LIPITOR) 20 MG tablet Take 20 mg by mouth at bedtime.      . bimatoprost (LUMIGAN) 0.01 % SOLN Place 1 drop into both eyes at bedtime.      . Calcium Citrate-Vitamin D (CALCIUM + D PO) Take by mouth. Calcium 1200 mg + Vitamin D 25 mg    . Calcium Polycarbophil (FIBERCON PO) Take by mouth every other day.    . clopidogrel (PLAVIX) 75 MG tablet Take 75 mg by mouth daily.      . Cyanocobalamin (VITAMIN B-12 PO) Place 2,500 mcg under the tongue daily.    . dorzolamide-timolol (COSOPT) 22.3-6.8 MG/ML ophthalmic solution Place 1 drop into both eyes 2 (two) times daily.    . Multiple Vitamins-Minerals (PRESERVISION AREDS 2 PO) Take by mouth.    . telmisartan (MICARDIS) 40 MG tablet Take 40 mg by mouth daily.     No  current facility-administered medications for this visit.    Allergies as of 12/05/2019 - Review Complete 12/05/2019  Allergen Reaction Noted  . Alendronate sodium  05/24/2011  . Other  12/05/2019    Vitals: BP (!) 150/80 (BP Location: Left Arm, Patient Position: Sitting)   Pulse 61   Temp (!) 96.9 F (36.1 C) Comment: husband 97.7  Ht 5\' 1"  (1.549 m)   Wt 111 lb (50.3 kg)   BMI 20.97 kg/m  Last Weight:  Wt Readings from Last 1 Encounters:  12/05/19 111 lb (50.3 kg)  Last Height:   Ht Readings from Last 1 Encounters:  12/05/19 5\' 1"  (1.549 m)     Physical exam: Exam: Gen: NAD, conversant, frail, thin, slightly tangential                  CV: RRR, no MRG. No Carotid Bruits. No peripheral edema, warm, nontender Eyes: Conjunctivae clear without exudates or hemorrhage  Neuro: Detailed Neurologic Exam  Speech:    Speech is normal; fluent and spontaneous with normal comprehension.  Cognition:  MMSE - Mini Mental State Exam 12/05/2019  Orientation to time 5  Orientation to Place 5  Registration 3  Attention/ Calculation 5  Recall 3  Language- name 2 objects 2  Language- repeat 1  Language- follow 3 step command 3  Language- read & follow direction 1  Write a sentence 1  Copy design 1  Total score 30       The patient is oriented to person, place, and time;     recent and remote memory intact;     language fluent;     normal attention, concentration,     fund of knowledge Cranial Nerves:    The pupils are equal, round, and reactive to light.  Visual fields are full to finger confrontation. Extraocular movements are intact. Trigeminal sensation is intact and the muscles of mastication are normal. The face is symmetric. The palate elevates in the midline. Hearing intact. Voice is normal. Shoulder shrug is normal. The tongue has normal motion without fasciculations.   Coordination:    No dysmetria or ataxia   Gait:   Shuffling with low clearance  Motor  Observation:    No asymmetry, no atrophy, and no involuntary movements noted. Tone:    Normal muscle tone.    Posture:    Kyphotic    Strength:    Strength is symmetrical in the upper and lower limbs, no focal weakness     Sensation: intact to LT     Reflex Exam:  DTR's: Absent left AJ otherwise deep tendon reflexes in the upper and lower extremities are slightly brisk bilaterally.   Toes:    The toes are equiv bilaterally.   Clonus:    Clonus is absent.    Assessment/Plan:  86 y.o. female here as requested by 85, MD for memory impairment.  Past medical history hypercholesterolemia, stroke, glaucoma, benign essential hypertension, esophageal reflux. She performed exceptional on the MMSE 30/30. I suspect this is normal cognitive aging but possibly some vascular problems in the brain may also be affecting her we will have to see what the MRI of the brain shows. She has a shuffling gait with low clearance should rule out NPH.   B12, TSH taken at PCP office, I will not reorder. Will check bmp for creatinine for the MRI.  MRI of the brain for reversible causes of dementia   Orders Placed This Encounter  Procedures  . MR BRAIN W WO CONTRAST  . Basic Metabolic Panel   No orders of the defined types were placed in this encounter.   Cc: Noni Saupe, MD,  Noni Saupe, MD  Noni Saupe, MD  [facility] [address] [place], [place]  Phone [phone] Fax [phone]

## 2019-12-05 NOTE — Telephone Encounter (Signed)
[employer] medicare order sent to [facility]. No auth they will reach out to the patient to schedule.  

## 2019-12-05 NOTE — Patient Instructions (Signed)
MRI of the brain Follow up in 6 months  Memory Compensation Strategies  1. Use "WARM" strategy.  W= write it down  A= associate it  R= repeat it  M= make a mental note  2.   You can keep a Glass blower/designer.  Use a 3-ring notebook with sections for the following: calendar, important names and phone numbers,  medications, doctors' names/phone numbers, lists/reminders, and a section to journal what you did  each day.   3.    Use a calendar to write appointments down.  4.    Write yourself a schedule for the day.  This can be placed on the calendar or in a separate section of the Memory Notebook.  Keeping a  regular schedule can help memory.  5.    Use medication organizer with sections for each day or morning/evening pills.  You may need help loading it  6.    Keep a basket, or pegboard by the door.  Place items that you need to take out with you in the basket or on the pegboard.  You may also want to  include a message board for reminders.  7.    Use sticky notes.  Place sticky notes with reminders in a place where the task is performed.  For example: " turn off the  stove" placed by the stove, "lock the door" placed on the door at eye level, " take your medications" on  the bathroom mirror or by the place where you normally take your medications.  8.    Use alarms/timers.  Use while cooking to remind yourself to check on food or as a reminder to take your medicine, or as a  reminder to make a call, or as a reminder to perform another task, etc.

## 2019-12-06 LAB — BASIC METABOLIC PANEL
BUN/Creatinine Ratio: 18 (ref 12–28)
BUN: 18 mg/dL (ref 10–36)
CO2: 23 mmol/L (ref 20–29)
Calcium: 9.9 mg/dL (ref 8.7–10.3)
Chloride: 108 mmol/L — ABNORMAL HIGH (ref 96–106)
Creatinine, Ser: 1.02 mg/dL — ABNORMAL HIGH (ref 0.57–1.00)
GFR calc Af Amer: 55 mL/min/{1.73_m2} — ABNORMAL LOW (ref >59)
GFR calc non Af Amer: 48 mL/min/{1.73_m2} — ABNORMAL LOW (ref >59)
Glucose: 86 mg/dL (ref 65–99)
Potassium: 4.2 mmol/L (ref 3.5–5.2)
Sodium: 146 mmol/L — ABNORMAL HIGH (ref 134–144)

## 2019-12-12 ENCOUNTER — Telehealth: Payer: Self-pay | Admitting: *Deleted

## 2019-12-12 NOTE — Telephone Encounter (Signed)
-----   Message from Anson Fret, MD sent at 12/06/2019  9:33 AM EST ----- Labs unremarkable thanks

## 2019-12-12 NOTE — Telephone Encounter (Signed)
Spoke with pt's husband and advised labs were unremarkable, no concerns. He verbalized understanding and said her scan is pending. MRI is April 10th. He understands we will call with those results when they are available. He verbalized appreciation for the call.

## 2020-01-04 ENCOUNTER — Ambulatory Visit
Admission: RE | Admit: 2020-01-04 | Discharge: 2020-01-04 | Disposition: A | Discharge status: Home / Self Care | Payer: Medicare Other | Source: Ambulatory Visit | Attending: Neurology | Admitting: Neurology

## 2020-01-04 DIAGNOSIS — R269 Unspecified abnormalities of gait and mobility: Secondary | ICD-10-CM

## 2020-01-04 DIAGNOSIS — R4701 Aphasia: Secondary | ICD-10-CM

## 2020-01-04 DIAGNOSIS — R413 Other amnesia: Secondary | ICD-10-CM | POA: Diagnosis not present

## 2020-01-04 MED ORDER — GADOBENATE DIMEGLUMINE 529 MG/ML IV SOLN
9.0000 mL | Freq: Once | INTRAVENOUS | Status: AC | PRN
  Administered 2020-01-04: 9 mL via INTRAVENOUS

## 2020-01-06 DIAGNOSIS — Z6821 Body mass index (BMI) 21.0-21.9, adult: Secondary | ICD-10-CM | POA: Diagnosis not present

## 2020-01-06 DIAGNOSIS — Z0000 Encounter for general adult medical examination without abnormal findings: Secondary | ICD-10-CM | POA: Diagnosis not present

## 2020-01-06 DIAGNOSIS — Z9181 History of falling: Secondary | ICD-10-CM | POA: Diagnosis not present

## 2020-01-08 ENCOUNTER — Telehealth: Payer: Self-pay | Admitting: *Deleted

## 2020-01-08 DIAGNOSIS — H401134 Primary open-angle glaucoma, bilateral, indeterminate stage: Secondary | ICD-10-CM | POA: Diagnosis not present

## 2020-01-08 DIAGNOSIS — R413 Other amnesia: Secondary | ICD-10-CM

## 2020-01-08 DIAGNOSIS — R4701 Aphasia: Secondary | ICD-10-CM

## 2020-01-08 NOTE — Telephone Encounter (Signed)
Spoke with pt's husband who was with her at the appt and we discussed the MRI brain result note from Dr. Lucia Gaskins. He believes the pt may be interested in neurocognitive testing but would like a call back tomorrow for me to speak with her to see what she decides. He verbalized appreciation for the call and his questions were answered.

## 2020-01-08 NOTE — Telephone Encounter (Signed)
-----   Message from Anson Fret, MD sent at 01/05/2020  7:50 PM EDT ----- There is atherosclerosis in the brain, white matter changes, and I suspect this is what may be causing her memory problems. No strokes or any reversible causes seen. We can send for formal memory testing if she is interested. thanks

## 2020-01-09 NOTE — Addendum Note (Signed)
Addended by: Bertram Savin on: 01/09/2020 06:03 PM   Modules accepted: Orders

## 2020-01-09 NOTE — Telephone Encounter (Addendum)
Called pt, husband answered and requested I call the home phone to speak with pt. I called home # and spoke with pt, we discussed the MRI results. Pt decided to proceed with the formal memory testing and her questions were answered. She verbalized appreciation for the call.   Referral ordered for Dr. Arley Phenix.

## 2020-01-10 ENCOUNTER — Other Ambulatory Visit: Payer: Self-pay | Admitting: Neurology

## 2020-01-10 DIAGNOSIS — R4189 Other symptoms and signs involving cognitive functions and awareness: Secondary | ICD-10-CM

## 2020-01-13 ENCOUNTER — Encounter: Payer: Self-pay | Admitting: Psychology

## 2020-01-13 NOTE — Telephone Encounter (Signed)
Noted Thanks Bethany referral will be sent .

## 2020-01-24 DIAGNOSIS — I10 Essential (primary) hypertension: Secondary | ICD-10-CM | POA: Diagnosis not present

## 2020-01-24 DIAGNOSIS — M1990 Unspecified osteoarthritis, unspecified site: Secondary | ICD-10-CM | POA: Diagnosis not present

## 2020-01-24 DIAGNOSIS — D519 Vitamin B12 deficiency anemia, unspecified: Secondary | ICD-10-CM | POA: Diagnosis not present

## 2020-02-26 ENCOUNTER — Telehealth: Payer: Self-pay | Admitting: Neurology

## 2020-02-26 DIAGNOSIS — L84 Corns and callosities: Secondary | ICD-10-CM | POA: Diagnosis not present

## 2020-02-26 DIAGNOSIS — B351 Tinea unguium: Secondary | ICD-10-CM | POA: Diagnosis not present

## 2020-02-26 NOTE — Telephone Encounter (Signed)
Samantha Thomas, Samantha Thomas  Patient's husband just called our office to cancel the appt with Dr. Kieth Brightly. He said she doesn't feel the need to come.

## 2020-04-25 DIAGNOSIS — I10 Essential (primary) hypertension: Secondary | ICD-10-CM | POA: Diagnosis not present

## 2020-04-25 DIAGNOSIS — M1990 Unspecified osteoarthritis, unspecified site: Secondary | ICD-10-CM | POA: Diagnosis not present

## 2020-04-25 DIAGNOSIS — E785 Hyperlipidemia, unspecified: Secondary | ICD-10-CM | POA: Diagnosis not present

## 2020-04-25 DIAGNOSIS — D519 Vitamin B12 deficiency anemia, unspecified: Secondary | ICD-10-CM | POA: Diagnosis not present

## 2020-05-18 ENCOUNTER — Ambulatory Visit: Payer: Medicare Other | Admitting: Psychology

## 2020-06-10 ENCOUNTER — Ambulatory Visit: Payer: Self-pay | Admitting: Family Medicine

## 2020-07-27 DEATH — deceased
